# Patient Record
Sex: Female | Born: 2000 | Race: White | Hispanic: No | Marital: Single | Smoking: Never smoker
Health system: Southern US, Community
[De-identification: ages and names within clinical notes are randomized; demographics above are authoritative.]

## PROBLEM LIST (undated history)

## (undated) DIAGNOSIS — F419 Anxiety disorder, unspecified: Secondary | ICD-10-CM

## (undated) DIAGNOSIS — M549 Dorsalgia, unspecified: Secondary | ICD-10-CM

## (undated) DIAGNOSIS — G43909 Migraine, unspecified, not intractable, without status migrainosus: Secondary | ICD-10-CM

## (undated) DIAGNOSIS — F410 Panic disorder [episodic paroxysmal anxiety] without agoraphobia: Secondary | ICD-10-CM

## (undated) DIAGNOSIS — R519 Headache, unspecified: Secondary | ICD-10-CM

## (undated) DIAGNOSIS — K224 Dyskinesia of esophagus: Secondary | ICD-10-CM

## (undated) DIAGNOSIS — I8500 Esophageal varices without bleeding: Secondary | ICD-10-CM

## (undated) DIAGNOSIS — R51 Headache: Secondary | ICD-10-CM

## (undated) HISTORY — PX: TONSILLECTOMY: SUR1361

## (undated) HISTORY — PX: TONSILLECTOMY AND ADENOIDECTOMY: SHX28

---
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
## 2020-08-05 ENCOUNTER — Ambulatory Visit: Payer: Self-pay

## 2020-08-07 ENCOUNTER — Emergency Department (INDEPENDENT_AMBULATORY_CARE_PROVIDER_SITE_OTHER)
Admission: RE | Admit: 2020-08-07 | Discharge: 2020-08-07 | Disposition: A | Discharge status: Home / Self Care | Payer: No Typology Code available for payment source | Source: Ambulatory Visit

## 2020-08-07 ENCOUNTER — Other Ambulatory Visit: Payer: Self-pay

## 2020-08-07 VITALS — BP 96/66 | HR 92 | Temp 98.0°F | Resp 18

## 2020-08-07 DIAGNOSIS — J069 Acute upper respiratory infection, unspecified: Secondary | ICD-10-CM | POA: Diagnosis not present

## 2020-08-07 DIAGNOSIS — J0100 Acute maxillary sinusitis, unspecified: Secondary | ICD-10-CM

## 2020-08-07 MED ORDER — BENZONATATE 100 MG PO CAPS: 100.0000 mg | ORAL_CAPSULE | Freq: Three times a day (TID) | ORAL | 0 refills | Status: AC

## 2020-08-07 MED ORDER — AMOXICILLIN-POT CLAVULANATE 875-125 MG PO TABS: 1.0000 | ORAL_TABLET | Freq: Two times a day (BID) | ORAL | 0 refills | Status: AC

## 2020-08-07 MED ORDER — FLUTICASONE PROPIONATE 50 MCG/ACT NA SUSP: 2.0000 | Freq: Every day | NASAL | 2 refills | Status: AC

## 2020-08-07 NOTE — Discharge Instructions (Signed)

## 2020-08-07 NOTE — ED Triage Notes (Signed)
Pt c/o bodyaches, fever, cough, chills and weakness since 01/08. COVID test neg on 01/10. No fever since. Continued Congestion and cough worse at night. Delsym and Claritin prn. Has had both COVID vaccines.

## 2020-08-07 NOTE — ED Provider Notes (Signed)
[facility]    CSN: [id number] Arrival date & time: 08/07/20  1424      History   Chief Complaint Chief Complaint  Patient presents with  . Cough    2pm apt  . Nasal Congestion  . Chills  . Weakness    HPI Shonique Noto is a 20 y.o. female.   HPI  Daveah Varone is a 20 y.o. female presenting to UC with c/o body aches, fever Tmax 100.7*F, cough, congestion and generalized weakness since 1/8. She took a COVID test on 1/10, which was negative. Cough and congestion has continued and she has developed sinus pain and pressure. She has taken Delsym and claritin as needed. She has had 3 doses of COVID vaccine with her booster 06/09/20.     Past Medical History:  Diagnosis Date  . Anxiety   . Back pain   . Esophageal spasm   . Esophageal varices (HCC)   . Generalized headaches   . Migraines   . Panic attack     Patient Active Problem List   Diagnosis Date Noted  . Major depression, recurrent (HCC) 06/30/2015  . GAD (generalized anxiety disorder) 06/30/2015  . Somatization disorder 06/30/2015  . Migraines 06/30/2015  . 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 Vasovagal syncope 04/22/2015  . Insomnia 04/22/2015  . Migraine with aura and without status migrainosus, not intractable 10/01/2014  . Tension headache 10/01/2014  . Anxiety state 10/01/2014  . Panic attack 10/01/2014    Past Surgical History:  Procedure Laterality Date  . TONSILLECTOMY    . TONSILLECTOMY AND ADENOIDECTOMY Bilateral     OB History   No obstetric history on file.      Home Medications    Prior to Admission medications   Medication Sig Start Date End Date Taking? Authorizing Provider  amoxicillin-clavulanate (AUGMENTIN) 875-125 MG tablet Take 1 tablet by mouth 2 (two) times daily. One po bid x 7 days 08/07/20  Yes Sahvannah Rieser O, PA-C  benzonatate (TESSALON) 100 MG capsule Take 1 capsule (100 mg total) by mouth every 8 (eight) hours. 08/07/20  Yes Aahana Elza, Vangie Bicker, PA-C   drospirenone-ethinyl estradiol (YAZ) 3-0.02 MG tablet Take 1 tablet by mouth daily. 03/13/20  Yes [provider]  fluticasone (FLONASE) 50 MCG/ACT nasal spray Place 2 sprays into both nostrils daily. 08/07/20  Yes Runell Kovich, Vangie Bicker, PA-C  lamoTRIgine (LAMICTAL) 100 MG tablet Take by mouth. 01/17/20  Yes [provider]  diazepam (VALIUM) 2 MG tablet Take 2 mg by mouth at bedtime. 10/28/14   [provider]  ibuprofen (ADVIL,MOTRIN) 200 MG tablet Take 200 mg by mouth every 6 (six) hours as needed.    [provider]  magnesium gluconate (MAGONATE) 500 MG tablet Take 500 mg by mouth 2 (two) times daily.    [provider]  mirtazapine (REMERON) 15 MG tablet Take 1 tablet (15 mg total) by mouth at bedtime. 06/30/15 06/29/16  Gayland Curry, MD  propranolol (INDERAL) 20 MG tablet Take 0.5 tablets (10 mg total) by mouth daily after lunch. 06/30/15   Gayland Curry, MD  riboflavin (VITAMIN B-2) 100 MG TABS tablet Take 100 mg by mouth daily.    [provider]    Family History Family History  Problem Relation Age of Onset  . Anxiety disorder Mother   . Headache Father        Cluster HAs  . ADD / ADHD Father   . Anxiety disorder Maternal Grandmother   . Migraines  Maternal Grandmother   . ADD / ADHD Paternal Grandfather   . Anxiety disorder Other     Social History Social History   Tobacco Use  . Smoking status: Never Smoker  . Smokeless tobacco: Never Used  Vaping Use  . Vaping Use: Never used  Substance Use Topics  . Alcohol use: No  . Drug use: Yes    Types: Marijuana     Allergies   Other   Review of Systems Review of Systems  Constitutional: Positive for fever. Negative for chills.  HENT: Positive for congestion, sinus pressure and sinus pain. Negative for ear pain, sore throat, trouble swallowing and voice change.   Respiratory: Positive for cough. Negative for shortness of breath.   Cardiovascular: Negative  for chest pain and palpitations.  Gastrointestinal: Negative for abdominal pain, diarrhea, nausea and vomiting.  Musculoskeletal: Positive for arthralgias, back pain and myalgias.  Skin: Negative for rash.  Neurological: Positive for weakness and headaches. Negative for dizziness and light-headedness.  All other systems reviewed and are negative.    Physical Exam Triage Vital Signs ED Triage Vitals [08/07/20 1459]  Enc Vitals Group     BP 96/66     Pulse Rate 92     Resp 18     Temp 98 F (36.7 C)     Temp Source Oral     SpO2 100 %     Weight      Height      Head Circumference      Peak Flow      Pain Score 0     Pain Loc      Pain Edu?      Excl. in GC?    No data found.  Updated Vital Signs BP 96/66 (BP Location: Left Arm)   Pulse 92   Temp 98 F (36.7 C) (Oral)   Resp 18   LMP 07/24/2020   SpO2 100%   Visual Acuity Right Eye Distance:   Left Eye Distance:   Bilateral Distance:    Right Eye Near:   Left Eye Near:    Bilateral Near:     Physical Exam Vitals and nursing note reviewed.  Constitutional:      General: She is not in acute distress.    Appearance: Normal appearance. She is well-developed and well-nourished. She is not ill-appearing, toxic-appearing or diaphoretic.  HENT:     Head: Normocephalic and atraumatic.     Right Ear: Tympanic membrane and ear canal normal.     Left Ear: Tympanic membrane and ear canal normal.     Nose: Congestion present.     Right Sinus: Maxillary sinus tenderness present. No frontal sinus tenderness.     Left Sinus: Maxillary sinus tenderness present. No frontal sinus tenderness.     Mouth/Throat:     Lips: Pink.     Mouth: Mucous membranes are moist.     Pharynx: Oropharynx is clear. Uvula midline. No pharyngeal swelling, oropharyngeal exudate, posterior oropharyngeal erythema or uvula swelling.  Eyes:     Extraocular Movements: EOM normal.  Cardiovascular:     Rate and Rhythm: Normal rate and regular  rhythm.  Pulmonary:     Effort: Pulmonary effort is normal. No respiratory distress.     Breath sounds: Normal breath sounds. No stridor. No wheezing, rhonchi or rales.  Musculoskeletal:        General: Normal range of motion.     Cervical back: Normal range of motion and neck supple. No tenderness.  Lymphadenopathy:     Cervical: No cervical adenopathy.  Skin:    General: Skin is warm and dry.  Neurological:     Mental Status: She is alert and oriented to person, place, and time.  Psychiatric:        Mood and Affect: Mood and affect normal.        Behavior: Behavior normal.      UC Treatments / Results  Labs (all labs ordered are listed, but only abnormal results are displayed) Labs Reviewed - No data to display  EKG   Radiology No results found.  Procedures Procedures (including critical care time)  Medications Ordered in UC Medications - No data to display  Initial Impression / Assessment and Plan / UC Course  I have reviewed the triage vital signs and the nursing notes.  Pertinent labs & imaging results that were available during my care of the patient were reviewed by me and considered in my medical decision making (see chart for details).     Hx and exam c/w maxillary sinusitis Rx: augmentin, tessalon and flonase F/u with PCP next week as needed AVS given  Final Clinical Impressions(s) / UC Diagnoses   Final diagnoses:  Upper respiratory tract infection, unspecified type  Acute non-recurrent maxillary sinusitis     Discharge Instructions      Please take antibiotics as prescribed and be sure to complete entire course even if you start to feel better to ensure infection does not come back.  You may take 500mg  acetaminophen every 4-6 hours or in combination with ibuprofen 400-600mg  every 6-8 hours as needed for pain, inflammation, and fever.  Be sure to well hydrated with clear liquids and get at least 8 hours of sleep at night, preferably more  while sick.   Please follow up with family medicine in 1 week if needed.     ED Prescriptions    Medication Sig Dispense Auth. Provider   amoxicillin-clavulanate (AUGMENTIN) 875-125 MG tablet Take 1 tablet by mouth 2 (two) times daily. One po bid x 7 days 14 tablet Mahima Hottle O, PA-C   benzonatate (TESSALON) 100 MG capsule Take 1 capsule (100 mg total) by mouth every 8 (eight) hours. 21 capsule 02-[birth year]1987, Yolonda Purtle O, PA-C   fluticasone (FLONASE) 50 MCG/ACT nasal spray Place 2 sprays into both nostrils daily. 16 g Doroteo Glassman, Lurene Shadow     PDMP not reviewed this encounter.   [place], Lurene Shadow 08/07/20 1556

## 2020-08-10 ENCOUNTER — Telehealth: Payer: Self-pay | Admitting: Emergency Medicine

## 2020-08-10 MED ORDER — DOXYCYCLINE HYCLATE 100 MG PO CAPS: 100.0000 mg | ORAL_CAPSULE | Freq: Two times a day (BID) | ORAL | 0 refills | Status: AC

## 2020-08-10 NOTE — Telephone Encounter (Signed)
Return call to Cady - confirmed DOB - per Scotlyn after her 3rd dose of Augmentin last night she had a fine raised rash come up on her face. She has not taken the Augmentin today. Pt did take amoxicillin for frequent sinus infections when she was younger. Syretta also confirmed she was not trying to get pregnant. Waylan Rocher, PA (provider here today) updated. Pt instructed to stop augmentin - allergies updated. Doxycycline script sent in to pharmacy on record per orders
